# Patient Record
Sex: Male | Born: 1980 | Race: White | Hispanic: No | Marital: Single | Smoking: Current every day smoker
Health system: Southern US, Community
[De-identification: ages and names within clinical notes are randomized; demographics above are authoritative.]

## PROBLEM LIST (undated history)

## (undated) DIAGNOSIS — F319 Bipolar disorder, unspecified: Secondary | ICD-10-CM

---

## 2005-05-08 ENCOUNTER — Emergency Department: Payer: Self-pay | Admitting: Emergency Medicine

## 2009-05-19 ENCOUNTER — Emergency Department: Payer: Self-pay | Admitting: Emergency Medicine

## 2009-10-31 ENCOUNTER — Emergency Department: Payer: Self-pay | Admitting: Internal Medicine

## 2010-03-25 ENCOUNTER — Emergency Department: Payer: Self-pay

## 2014-10-05 ENCOUNTER — Emergency Department: Payer: Self-pay | Admitting: Emergency Medicine

## 2014-10-05 LAB — CBC
HCT: 44 % (ref 40.0–52.0)
HGB: 14.6 g/dL (ref 13.0–18.0)
MCH: 31.6 pg (ref 26.0–34.0)
MCHC: 33.3 g/dL (ref 32.0–36.0)
MCV: 95 fL (ref 80–100)
Platelet: 244 10*3/uL (ref 150–440)
RBC: 4.63 10*6/uL (ref 4.40–5.90)
RDW: 12 % (ref 11.5–14.5)
WBC: 9.4 10*3/uL (ref 3.8–10.6)

## 2014-10-05 LAB — URINALYSIS, COMPLETE
BILIRUBIN, UR: NEGATIVE
Bacteria: NONE SEEN
Blood: NEGATIVE
Glucose,UR: NEGATIVE mg/dL (ref 0–75)
KETONE: NEGATIVE
Nitrite: NEGATIVE
Ph: 6 (ref 4.5–8.0)
Protein: 30
SQUAMOUS EPITHELIAL: NONE SEEN
Specific Gravity: 1.025 (ref 1.003–1.030)

## 2014-10-05 LAB — COMPREHENSIVE METABOLIC PANEL
ALT: 14 U/L
ANION GAP: 5 — AB (ref 7–16)
Albumin: 3.5 g/dL (ref 3.4–5.0)
Alkaline Phosphatase: 107 U/L
BUN: 11 mg/dL (ref 7–18)
Bilirubin,Total: 0.3 mg/dL (ref 0.2–1.0)
CALCIUM: 8.4 mg/dL — AB (ref 8.5–10.1)
CO2: 30 mmol/L (ref 21–32)
CREATININE: 1.21 mg/dL (ref 0.60–1.30)
Chloride: 104 mmol/L (ref 98–107)
EGFR (African American): >60
EGFR (Non-African Amer.): >60
Glucose: 95 mg/dL (ref 65–99)
Osmolality: 277 (ref 275–301)
Potassium: 3.8 mmol/L (ref 3.5–5.1)
SGOT(AST): 21 U/L (ref 15–37)
SODIUM: 139 mmol/L (ref 136–145)
TOTAL PROTEIN: 7.5 g/dL (ref 6.4–8.2)

## 2014-10-06 LAB — CK: CK, TOTAL: 63 U/L (ref 39–308)

## 2015-12-16 ENCOUNTER — Emergency Department (HOSPITAL_COMMUNITY): Payer: Self-pay

## 2015-12-16 ENCOUNTER — Emergency Department (HOSPITAL_COMMUNITY)
Admission: EM | Admit: 2015-12-16 | Discharge: 2015-12-16 | Disposition: A | Discharge status: Home / Self Care | Payer: Self-pay | Attending: Emergency Medicine | Admitting: Emergency Medicine

## 2015-12-16 ENCOUNTER — Encounter (HOSPITAL_COMMUNITY): Payer: Self-pay | Admitting: Emergency Medicine

## 2015-12-16 DIAGNOSIS — Y929 Unspecified place or not applicable: Secondary | ICD-10-CM | POA: Insufficient documentation

## 2015-12-16 DIAGNOSIS — Y939 Activity, unspecified: Secondary | ICD-10-CM | POA: Insufficient documentation

## 2015-12-16 DIAGNOSIS — Y999 Unspecified external cause status: Secondary | ICD-10-CM | POA: Insufficient documentation

## 2015-12-16 DIAGNOSIS — S8001XA Contusion of right knee, initial encounter: Secondary | ICD-10-CM | POA: Insufficient documentation

## 2015-12-16 DIAGNOSIS — F17210 Nicotine dependence, cigarettes, uncomplicated: Secondary | ICD-10-CM | POA: Insufficient documentation

## 2015-12-16 DIAGNOSIS — W2209XA Striking against other stationary object, initial encounter: Secondary | ICD-10-CM | POA: Insufficient documentation

## 2015-12-16 MED ORDER — DICLOFENAC SODIUM 50 MG PO TBEC: 50.0000 mg | DELAYED_RELEASE_TABLET | Freq: Three times a day (TID) | ORAL | Status: DC

## 2015-12-16 MED ORDER — HYDROCODONE-ACETAMINOPHEN 5-325 MG PO TABS: 2.0000 | ORAL_TABLET | ORAL | Status: DC | PRN
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
## 2015-12-16 NOTE — ED Notes (Signed)
Pt reports hitting inside of RT knee on Friday on concrete. States pain has progressively increased. Pt ambulatory.

## 2015-12-16 NOTE — Discharge Instructions (Signed)

## 2016-04-23 ENCOUNTER — Encounter (HOSPITAL_COMMUNITY): Payer: Self-pay | Admitting: Emergency Medicine

## 2016-04-23 ENCOUNTER — Emergency Department (HOSPITAL_COMMUNITY): Payer: No Typology Code available for payment source

## 2016-04-23 ENCOUNTER — Other Ambulatory Visit (HOSPITAL_COMMUNITY): Payer: Self-pay | Admitting: Family Medicine

## 2016-04-23 ENCOUNTER — Emergency Department (HOSPITAL_COMMUNITY)
Admission: EM | Admit: 2016-04-23 | Discharge: 2016-04-23 | Disposition: A | Discharge status: Home / Self Care | Payer: No Typology Code available for payment source | Attending: Emergency Medicine | Admitting: Emergency Medicine

## 2016-04-23 DIAGNOSIS — Y939 Activity, unspecified: Secondary | ICD-10-CM | POA: Insufficient documentation

## 2016-04-23 DIAGNOSIS — Y999 Unspecified external cause status: Secondary | ICD-10-CM | POA: Insufficient documentation

## 2016-04-23 DIAGNOSIS — F17210 Nicotine dependence, cigarettes, uncomplicated: Secondary | ICD-10-CM | POA: Diagnosis not present

## 2016-04-23 DIAGNOSIS — S39012A Strain of muscle, fascia and tendon of lower back, initial encounter: Secondary | ICD-10-CM | POA: Diagnosis not present

## 2016-04-23 DIAGNOSIS — M25521 Pain in right elbow: Secondary | ICD-10-CM | POA: Diagnosis not present

## 2016-04-23 DIAGNOSIS — M79642 Pain in left hand: Secondary | ICD-10-CM | POA: Diagnosis not present

## 2016-04-23 DIAGNOSIS — M7918 Myalgia, other site: Secondary | ICD-10-CM

## 2016-04-23 DIAGNOSIS — M25562 Pain in left knee: Secondary | ICD-10-CM | POA: Insufficient documentation

## 2016-04-23 DIAGNOSIS — M545 Low back pain: Secondary | ICD-10-CM | POA: Diagnosis present

## 2016-04-23 DIAGNOSIS — Y92410 Unspecified street and highway as the place of occurrence of the external cause: Secondary | ICD-10-CM | POA: Diagnosis not present

## 2016-04-23 MED ORDER — METHOCARBAMOL 500 MG PO TABS: 500.0000 mg | ORAL_TABLET | Freq: Three times a day (TID) | ORAL | 0 refills | Status: DC

## 2016-04-23 MED ORDER — DICLOFENAC SODIUM 75 MG PO TBEC: 75.0000 mg | DELAYED_RELEASE_TABLET | Freq: Two times a day (BID) | ORAL | 0 refills | Status: DC
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
## 2016-05-21 ENCOUNTER — Encounter: Payer: Self-pay | Admitting: Emergency Medicine

## 2016-05-21 ENCOUNTER — Emergency Department
Admission: EM | Admit: 2016-05-21 | Discharge: 2016-05-21 | Disposition: A | Discharge status: Home / Self Care | Payer: No Typology Code available for payment source | Attending: Emergency Medicine | Admitting: Emergency Medicine

## 2016-05-21 ENCOUNTER — Emergency Department: Payer: No Typology Code available for payment source

## 2016-05-21 DIAGNOSIS — Y999 Unspecified external cause status: Secondary | ICD-10-CM | POA: Insufficient documentation

## 2016-05-21 DIAGNOSIS — F17210 Nicotine dependence, cigarettes, uncomplicated: Secondary | ICD-10-CM | POA: Diagnosis not present

## 2016-05-21 DIAGNOSIS — Y92410 Unspecified street and highway as the place of occurrence of the external cause: Secondary | ICD-10-CM | POA: Diagnosis not present

## 2016-05-21 DIAGNOSIS — Z791 Long term (current) use of non-steroidal anti-inflammatories (NSAID): Secondary | ICD-10-CM | POA: Diagnosis not present

## 2016-05-21 DIAGNOSIS — Y939 Activity, unspecified: Secondary | ICD-10-CM | POA: Insufficient documentation

## 2016-05-21 DIAGNOSIS — M5416 Radiculopathy, lumbar region: Secondary | ICD-10-CM | POA: Diagnosis not present

## 2016-05-21 DIAGNOSIS — M545 Low back pain: Secondary | ICD-10-CM | POA: Diagnosis present

## 2016-05-21 MED ORDER — TRAMADOL HCL 50 MG PO TABS: 50.0000 mg | ORAL_TABLET | Freq: Four times a day (QID) | ORAL | 0 refills | Status: DC | PRN

## 2016-05-21 MED ORDER — BACLOFEN 10 MG PO TABS: 10.0000 mg | ORAL_TABLET | Freq: Three times a day (TID) | ORAL | 0 refills | Status: DC
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
## 2017-02-13 ENCOUNTER — Emergency Department
Admission: EM | Admit: 2017-02-13 | Discharge: 2017-02-13 | Disposition: A | Discharge status: Home / Self Care | Payer: Self-pay | Attending: Emergency Medicine | Admitting: Emergency Medicine

## 2017-02-13 DIAGNOSIS — Z5321 Procedure and treatment not carried out due to patient leaving prior to being seen by health care provider: Secondary | ICD-10-CM | POA: Insufficient documentation

## 2017-02-13 DIAGNOSIS — L02415 Cutaneous abscess of right lower limb: Secondary | ICD-10-CM | POA: Insufficient documentation

## 2017-02-13 DIAGNOSIS — F17210 Nicotine dependence, cigarettes, uncomplicated: Secondary | ICD-10-CM | POA: Insufficient documentation

## 2017-02-13 LAB — COMPREHENSIVE METABOLIC PANEL
ALT: 11 U/L — AB (ref 17–63)
AST: 22 U/L (ref 15–41)
Albumin: 4.1 g/dL (ref 3.5–5.0)
Alkaline Phosphatase: 95 U/L (ref 38–126)
Anion gap: 8 (ref 5–15)
BUN: 14 mg/dL (ref 6–20)
CHLORIDE: 103 mmol/L (ref 101–111)
CO2: 28 mmol/L (ref 22–32)
CREATININE: 0.89 mg/dL (ref 0.61–1.24)
Calcium: 8.9 mg/dL (ref 8.9–10.3)
GFR calc non Af Amer: >60 mL/min (ref >60)
Glucose, Bld: 72 mg/dL (ref 65–99)
Potassium: 3.9 mmol/L (ref 3.5–5.1)
SODIUM: 139 mmol/L (ref 135–145)
Total Bilirubin: 0.3 mg/dL (ref 0.3–1.2)
Total Protein: 7.4 g/dL (ref 6.5–8.1)

## 2017-02-13 LAB — CBC WITH DIFFERENTIAL/PLATELET
Basophils Absolute: 0.1 10*3/uL (ref 0–0.1)
Basophils Relative: 1 %
Eosinophils Absolute: 0.3 10*3/uL (ref 0–0.7)
Eosinophils Relative: 2 %
HEMATOCRIT: 42.2 % (ref 40.0–52.0)
HEMOGLOBIN: 14.6 g/dL (ref 13.0–18.0)
LYMPHS ABS: 2.6 10*3/uL (ref 1.0–3.6)
LYMPHS PCT: 17 %
MCH: 32.1 pg (ref 26.0–34.0)
MCHC: 34.6 g/dL (ref 32.0–36.0)
MCV: 92.6 fL (ref 80.0–100.0)
MONOS PCT: 8 %
Monocytes Absolute: 1.2 10*3/uL — ABNORMAL HIGH (ref 0.2–1.0)
NEUTROS PCT: 74 %
Neutro Abs: 11.2 10*3/uL — ABNORMAL HIGH (ref 1.4–6.5)
Platelets: 255 10*3/uL (ref 150–440)
RBC: 4.56 MIL/uL (ref 4.40–5.90)
RDW: 13.2 % (ref 11.5–14.5)
WBC: 15.3 10*3/uL — ABNORMAL HIGH (ref 3.8–10.6)

## 2017-02-13 NOTE — ED Triage Notes (Signed)
Patient c/o abscess to back of leg beginning yesterday. Patient has redness/warmth around abscess beginning today, extending approx 8-10" vertically and 4-6" horizontally.

## 2017-02-14 ENCOUNTER — Emergency Department
Admission: EM | Admit: 2017-02-14 | Discharge: 2017-02-14 | Disposition: A | Discharge status: Home / Self Care | Payer: Self-pay | Attending: Emergency Medicine | Admitting: Emergency Medicine

## 2017-02-14 ENCOUNTER — Encounter: Payer: Self-pay | Admitting: Emergency Medicine

## 2017-02-14 DIAGNOSIS — L02415 Cutaneous abscess of right lower limb: Secondary | ICD-10-CM | POA: Insufficient documentation

## 2017-02-14 DIAGNOSIS — F17210 Nicotine dependence, cigarettes, uncomplicated: Secondary | ICD-10-CM | POA: Insufficient documentation

## 2017-02-14 MED ORDER — OXYCODONE-ACETAMINOPHEN 7.5-325 MG PO TABS: 1.0000 | ORAL_TABLET | Freq: Four times a day (QID) | ORAL | 0 refills | Status: AC | PRN

## 2017-02-14 MED ORDER — SULFAMETHOXAZOLE-TRIMETHOPRIM 800-160 MG PO TABS: 1.0000 | ORAL_TABLET | Freq: Two times a day (BID) | ORAL | 0 refills | Status: AC

## 2017-02-14 MED ORDER — LIDOCAINE HCL (PF) 1 % IJ SOLN
INTRAMUSCULAR | Status: AC
  Filled 2017-02-14: qty 5

## 2017-02-14 MED ORDER — SULFAMETHOXAZOLE-TRIMETHOPRIM 800-160 MG PO TABS
1.0000 | ORAL_TABLET | Freq: Once | ORAL | Status: AC
  Administered 2017-02-14: 1 via ORAL
  Filled 2017-02-14: qty 1

## 2017-02-14 MED ORDER — OXYCODONE-ACETAMINOPHEN 5-325 MG PO TABS
1.0000 | ORAL_TABLET | Freq: Once | ORAL | Status: AC
  Administered 2017-02-14: 1 via ORAL
  Filled 2017-02-14: qty 1

## 2017-02-14 MED ORDER — IBUPROFEN 600 MG PO TABS: 600.0000 mg | ORAL_TABLET | Freq: Three times a day (TID) | ORAL | 0 refills | Status: AC | PRN

## 2017-02-14 MED ORDER — IBUPROFEN 600 MG PO TABS
600.0000 mg | ORAL_TABLET | Freq: Once | ORAL | Status: AC
Start: 2017-02-14 — End: 2017-02-14
  Administered 2017-02-14: 600 mg via ORAL
  Filled 2017-02-14: qty 1

## 2017-02-14 NOTE — ED Notes (Signed)
Patient provided information for Medication Management to help him get back on his medications

## 2017-02-14 NOTE — ED Notes (Signed)
Pt with large abscess to his right calf; has tried to open area to drain with little amount of drainage; pt encouraged not to open areas at home; redness and warmth to entire calf with swelling also present; PA Smith at bedside

## 2017-02-14 NOTE — ED Provider Notes (Signed)
[facility] Emergency Department Provider Note   ____________________________________________   First MD Initiated Contact with Patient 02/14/17 2255     (approximate)  I have reviewed the triage vital signs and the nursing notes.   HISTORY  Chief Complaint Abscess    HPI William Mccarthy is a 36 y.o. male patient presents today with pain, edema, and drainage right calf. Patient was here yesterday but left without being seen due to weight time. Patient say went home and "pop" the lesion and pain increased.patient rates the pain as a 9/10. Patient described a pain as "throbbing". Except for dressing no other palliative measures prior to arrival.   History reviewed. No pertinent past medical history.  There are no active problems to display for this patient.   History reviewed. No pertinent surgical history.  Prior to Admission medications   Medication Sig Start Date End Date Taking? Authorizing Provider  ibuprofen (ADVIL,MOTRIN) 600 MG tablet Take 1 tablet (600 mg total) by mouth every 8 (eight) hours as needed. 02/14/17   Joni ReiningSmith, Ronald K, PA-C  oxyCODONE-acetaminophen (PERCOCET) 7.5-325 MG tablet Take 1 tablet by mouth every 6 (six) hours as needed for severe pain. 02/14/17   Joni ReiningSmith, Ronald K, PA-C  sulfamethoxazole-trimethoprim (BACTRIM DS,SEPTRA DS) 800-160 MG tablet Take 1 tablet by mouth 2 (two) times daily. 02/14/17   Joni ReiningSmith, Ronald K, PA-C    Allergies Bee venom  Family History  Problem Relation Age of Onset  . Diabetes Mother     Social History Social History  Substance Use Topics  . Smoking status: Current Every Day Smoker    Packs/day: 0.50    Types: Cigarettes  . Smokeless tobacco: Never Used  . Alcohol use No    Review of Systems  Constitutional: No fever/chills Eyes: No visual changes. ENT: No sore throat. Cardiovascular: Denies chest pain. Respiratory: Denies shortness of breath. Gastrointestinal: No abdominal pain.  No  nausea, no vomiting.  No diarrhea.  No constipation. Genitourinary: Negative for dysuria. Musculoskeletal: Negative for back pain. Skin: Negative for rash. Neurological: Negative for headaches, focal weakness or numbness.   ____________________________________________   PHYSICAL EXAM:  VITAL SIGNS: ED Triage Vitals  Enc Vitals Group     BP 02/14/17 2239 139/86     Pulse Rate 02/14/17 2239 99     Resp 02/14/17 2239 18     Temp 02/14/17 2239 99.1 F (37.3 C)     Temp Source 02/14/17 2239 Oral     SpO2 02/14/17 2239 98 %     Weight 02/14/17 2240 183 lb (83 kg)     Height 02/14/17 2240 5\' 5"  (1.651 m)     Head Circumference --      Peak Flow --      Pain Score 02/14/17 2241 9     Pain Loc --      Pain Edu? --      Excl. in GC? --     Constitutional: Alert and oriented. Well appearing and in no acute distress. Cardiovascular: Normal rate, regular rhythm. Grossly normal heart sounds.  Good peripheral circulation. Respiratory: Normal respiratory effort.  No retractions. Lungs CTAB. Neurologic:  Normal speech and language. No gross focal neurologic deficits are appreciated. No gait instability. Skin:  Skin is warm, dry and intact. No rash noted.nodule lesion on erythematous base with moderate amount of drainage. Psychiatric: Mood and affect are normal. Speech and behavior are normal.  ____________________________________________   LABS (all labs ordered are listed, but only abnormal results  are displayed)  Labs Reviewed - No data to display ____________________________________________  EKG   ____________________________________________  RADIOLOGY   ____________________________________________   PROCEDURES  Procedure(s) performed: INCISION AND DRAINAGE Performed by: Joni Reining Consent: Verbal consent obtained. Risks and benefits: risks, benefits and alternatives were discussed Type: abscess  Body area: right calf Anesthesia: local infiltration  Incision  was made with a scalpel.  Local anesthetic: lidocaine 1% without epinephrine  Anesthetic total: 5ml  Complexity: complex Blunt dissection to break up loculations  Drainage: purulent  Drainage amount:small Packing material: 1/4 in iodoform gauze  Patient tolerance: Patient tolerated the procedure well with no immediate complications.     Procedures  Critical Care performed: No  ____________________________________________   INITIAL IMPRESSION / ASSESSMENT AND PLAN / ED COURSE  Pertinent labs & imaging results that were available during my care of the patient were reviewed by me and considered in my medical decision making (see chart for details).  Abscess right calf. Patient given discharge care instructions. Patient advised follow-up in 2 days for wound check. Patient given a work note.      ____________________________________________   FINAL CLINICAL IMPRESSION(S) / ED DIAGNOSES  Final diagnoses:  Abscess of right leg excluding foot      NEW MEDICATIONS STARTED DURING THIS VISIT:  New Prescriptions   IBUPROFEN (ADVIL,MOTRIN) 600 MG TABLET    Take 1 tablet (600 mg total) by mouth every 8 (eight) hours as needed.   OXYCODONE-ACETAMINOPHEN (PERCOCET) 7.5-325 MG TABLET    Take 1 tablet by mouth every 6 (six) hours as needed for severe pain.   SULFAMETHOXAZOLE-TRIMETHOPRIM (BACTRIM DS,SEPTRA DS) 800-160 MG TABLET    Take 1 tablet by mouth 2 (two) times daily.     Note:  This document was prepared using Dragon voice recognition software and may include unintentional dictation errors.    Joni Reining, PA-C 02/14/17 2324    Pershing Proud Myra Rude, MD 02/14/17 [phone]

## 2017-02-14 NOTE — ED Triage Notes (Signed)
Pt was seen here yesterday but LWABS due to wait time. Pt has red open area to the back to right leg. Pt report area has a throbbing pain when siting still.Pt reports area has been covered since pt "popped it."

## 2017-02-16 ENCOUNTER — Encounter: Payer: Self-pay | Admitting: Emergency Medicine

## 2017-02-16 ENCOUNTER — Encounter: Payer: Self-pay | Admitting: Intensive Care

## 2017-02-16 ENCOUNTER — Emergency Department
Admission: EM | Admit: 2017-02-16 | Discharge: 2017-02-16 | Disposition: A | Discharge status: Home / Self Care | Payer: Self-pay | Attending: Student in an Organized Health Care Education/Training Program | Admitting: Student in an Organized Health Care Education/Training Program

## 2017-02-16 ENCOUNTER — Emergency Department
Admission: EM | Admit: 2017-02-16 | Discharge: 2017-02-16 | Disposition: A | Discharge status: Home / Self Care | Payer: Self-pay | Attending: Emergency Medicine | Admitting: Emergency Medicine

## 2017-02-16 DIAGNOSIS — Z4800 Encounter for change or removal of nonsurgical wound dressing: Secondary | ICD-10-CM | POA: Insufficient documentation

## 2017-02-16 DIAGNOSIS — F17210 Nicotine dependence, cigarettes, uncomplicated: Secondary | ICD-10-CM | POA: Insufficient documentation

## 2017-02-16 DIAGNOSIS — Z5189 Encounter for other specified aftercare: Secondary | ICD-10-CM

## 2017-02-16 DIAGNOSIS — Z5321 Procedure and treatment not carried out due to patient leaving prior to being seen by health care provider: Secondary | ICD-10-CM | POA: Insufficient documentation

## 2017-02-16 NOTE — ED Provider Notes (Signed)
[facility] Emergency Department Provider Note   ____________________________________________   None    (approximate)  I have reviewed the triage vital signs and the nursing notes.   HISTORY  Chief Complaint Recurrent Skin Infections    HPI William Mccarthy is a 36 y.o. male patient presents for wound check secondary to incision and drainages of an abscess of the right calf. Procedures done 2 days ago.patient rates pain as a 9/10. Patient describes pain as "achy". Patient denies any fevers status post procedure. Patient said drainage has been mild.   History reviewed. No pertinent past medical history.  There are no active problems to display for this patient.   History reviewed. No pertinent surgical history.  Prior to Admission medications   Medication Sig Start Date End Date Taking? Authorizing Provider  ibuprofen (ADVIL,MOTRIN) 600 MG tablet Take 1 tablet (600 mg total) by mouth every 8 (eight) hours as needed. 02/14/17   Joni ReiningSmith, Ronald K, PA-C  oxyCODONE-acetaminophen (PERCOCET) 7.5-325 MG tablet Take 1 tablet by mouth every 6 (six) hours as needed for severe pain. 02/14/17   Joni ReiningSmith, Ronald K, PA-C  sulfamethoxazole-trimethoprim (BACTRIM DS,SEPTRA DS) 800-160 MG tablet Take 1 tablet by mouth 2 (two) times daily. 02/14/17   Joni ReiningSmith, Ronald K, PA-C    Allergies Bee venom  Family History  Problem Relation Age of Onset  . Diabetes Mother     Social History Social History  Substance Use Topics  . Smoking status: Current Every Day Smoker    Packs/day: 0.50    Types: Cigarettes  . Smokeless tobacco: Never Used  . Alcohol use No    Review of Systems  Constitutional: No fever/chills Cardiovascular: Denies chest pain. Respiratory: Denies shortness of breath. Skin: Negative for rash. Abscess right leg Neurological: Negative for headaches, focal weakness or numbness. Allergic/Immunilogical:bee  sting  ____________________________________________   PHYSICAL EXAM:  VITAL SIGNS: ED Triage Vitals  Enc Vitals Group     BP 02/16/17 1600 136/81     Pulse Rate 02/16/17 1600 69     Resp 02/16/17 1600 18     Temp 02/16/17 1600 98 F (36.7 C)     Temp Source 02/16/17 1600 Oral     SpO2 02/16/17 1600 99 %     Weight 02/16/17 1603 183 lb (83 kg)     Height 02/16/17 1603 5\' 5"  (1.651 m)     Head Circumference --      Peak Flow --      Pain Score 02/16/17 1603 9     Pain Loc --      Pain Edu? --      Excl. in GC? --     Constitutional: Alert and oriented. Well appearing and in no acute distress. Cardiovascular: Normal rate, regular rhythm. Grossly normal heart sounds.  Good peripheral circulation. Respiratory: Normal respiratory effort.  No retractions. Lungs CTAB. Neurologic:  Normal speech and language. No gross focal neurologic deficits are appreciated. No gait instability. Skin:  Skin is warm, dry and intact. No rash noted. Mild drainage status post Idofoam removal. Clear return status post normal saline irrigation. :Psychiatric: Mood and affect are normal. Speech and behavior are normal.  ____________________________________________   LABS (all labs ordered are listed, but only abnormal results are displayed)  Labs Reviewed - No data to display ____________________________________________  EKG   ____________________________________________  RADIOLOGY   ____________________________________________   PROCEDURES  Procedure(s) performed: None  Procedures  Critical Care performed: No  ____________________________________________   INITIAL IMPRESSION /  ASSESSMENT AND PLAN / ED COURSE  Pertinent labs & imaging results that were available during my care of the patient were reviewed by me and considered in my medical decision making (see chart for details).  Wound check reveals healing abscess right lower leg. Patient advised continue antibiotics as  directed. Patient given instructions upon discharge. Patient given a work note. Patient advised to follow-up condition worsens.      ____________________________________________   FINAL CLINICAL IMPRESSION(S) / ED DIAGNOSES  Final diagnoses:  Wound check, abscess      NEW MEDICATIONS STARTED DURING THIS VISIT:  New Prescriptions   No medications on file     Note:  This document was prepared using Dragon voice recognition software and may include unintentional dictation errors.    Joni Reining, PA-C 02/16/17 1629    Willy Eddy, MD 02/16/17 [phone]

## 2017-02-16 NOTE — ED Triage Notes (Signed)
Patient reports he is here today for follow up on abscess on his R calf. Was seen here Sunday and states they told him to come back today to have it checked out. Ambulated back to room with no problems. C/o 9 out of 10 pain in R calf

## 2017-02-16 NOTE — ED Triage Notes (Signed)
Pt here for wound recheck to right leg

## 2017-02-16 NOTE — ED Notes (Signed)
NAD noted at time of D/C. Pt denies questions or concerns. Pt ambulatory to the lobby at this time.  

## 2017-02-16 NOTE — Discharge Instructions (Signed)
Continue previous medication and daily wound care as directed.

## 2017-05-30 ENCOUNTER — Emergency Department
Admission: EM | Admit: 2017-05-30 | Discharge: 2017-05-30 | Disposition: A | Discharge status: Home / Self Care | Payer: Self-pay | Attending: Emergency Medicine | Admitting: Emergency Medicine

## 2017-05-30 DIAGNOSIS — F319 Bipolar disorder, unspecified: Secondary | ICD-10-CM | POA: Insufficient documentation

## 2017-05-30 DIAGNOSIS — F3161 Bipolar disorder, current episode mixed, mild: Secondary | ICD-10-CM

## 2017-05-30 DIAGNOSIS — F17210 Nicotine dependence, cigarettes, uncomplicated: Secondary | ICD-10-CM | POA: Insufficient documentation

## 2017-05-30 DIAGNOSIS — F1290 Cannabis use, unspecified, uncomplicated: Secondary | ICD-10-CM | POA: Insufficient documentation

## 2017-05-30 DIAGNOSIS — Z9119 Patient's noncompliance with other medical treatment and regimen: Secondary | ICD-10-CM | POA: Insufficient documentation

## 2017-05-30 DIAGNOSIS — F1520 Other stimulant dependence, uncomplicated: Secondary | ICD-10-CM | POA: Insufficient documentation

## 2017-05-30 HISTORY — DX: Bipolar disorder, unspecified: F31.9

## 2017-05-30 LAB — URINE DRUG SCREEN, QUALITATIVE (ARMC ONLY)
Amphetamines, Ur Screen: POSITIVE — AB
Barbiturates, Ur Screen: NOT DETECTED
Benzodiazepine, Ur Scrn: NOT DETECTED
CANNABINOID 50 NG, UR ~~LOC~~: POSITIVE — AB
COCAINE METABOLITE, UR ~~LOC~~: NOT DETECTED
MDMA (ECSTASY) UR SCREEN: NOT DETECTED
Methadone Scn, Ur: NOT DETECTED
OPIATE, UR SCREEN: NOT DETECTED
PHENCYCLIDINE (PCP) UR S: NOT DETECTED
TRICYCLIC, UR SCREEN: NOT DETECTED

## 2017-05-30 LAB — CBC
HCT: 43.6 % (ref 40.0–52.0)
HEMOGLOBIN: 15.6 g/dL (ref 13.0–18.0)
MCH: 33 pg (ref 26.0–34.0)
MCHC: 35.7 g/dL (ref 32.0–36.0)
MCV: 92.6 fL (ref 80.0–100.0)
Platelets: 240 10*3/uL (ref 150–440)
RBC: 4.71 MIL/uL (ref 4.40–5.90)
RDW: 12.9 % (ref 11.5–14.5)
WBC: 10.3 10*3/uL (ref 3.8–10.6)

## 2017-05-30 LAB — SALICYLATE LEVEL: Salicylate Lvl: <7 mg/dL (ref 2.8–30.0)

## 2017-05-30 LAB — COMPREHENSIVE METABOLIC PANEL
ALT: 10 U/L — ABNORMAL LOW (ref 17–63)
ANION GAP: 7 (ref 5–15)
AST: 19 U/L (ref 15–41)
Albumin: 4.1 g/dL (ref 3.5–5.0)
Alkaline Phosphatase: 69 U/L (ref 38–126)
BUN: 8 mg/dL (ref 6–20)
CHLORIDE: 102 mmol/L (ref 101–111)
CO2: 28 mmol/L (ref 22–32)
CREATININE: 1.07 mg/dL (ref 0.61–1.24)
Calcium: 9.1 mg/dL (ref 8.9–10.3)
GLUCOSE: 116 mg/dL — AB (ref 65–99)
Potassium: 3.5 mmol/L (ref 3.5–5.1)
SODIUM: 137 mmol/L (ref 135–145)
TOTAL PROTEIN: 7.1 g/dL (ref 6.5–8.1)
Total Bilirubin: 0.9 mg/dL (ref 0.3–1.2)

## 2017-05-30 LAB — ETHANOL: Alcohol, Ethyl (B): <5 mg/dL (ref <5)

## 2017-05-30 LAB — ACETAMINOPHEN LEVEL: Acetaminophen (Tylenol), Serum: <10 ug/mL — ABNORMAL LOW (ref 10–30)

## 2017-05-30 NOTE — ED Notes (Signed)
BEHAVIORAL HEALTH ROUNDING  Patient sleeping: No.  Patient alert and oriented: yes  Behavior appropriate: Yes. ; If no, describe:  Nutrition and fluids offered: Yes  Toileting and hygiene offered: Yes  Sitter present: not applicable, Q 15 min safety rounds and observation.  Law enforcement present: Yes ODS  

## 2017-05-30 NOTE — ED Notes (Signed)
Pt Belonging Bag: Crystal LawnsBelt, Shorts, Briefs, NordstromFlip flops, CrenshawShirt, box of Cigarettes, Wallet no cash inside, cell phone. Pt bag passed off to Ryland GroupQuad RN.

## 2017-05-30 NOTE — ED Provider Notes (Signed)
[facility] Emergency Department Provider Note   ____________________________________________   First MD Initiated Contact with Patient 05/30/17 0151     (approximate)  I have reviewed the triage vital signs and the nursing notes.   HISTORY  Chief Complaint Medical Clearance    HPI William Mccarthy is a 36 y.o. male who presents to the ED escorted by police with a chief complaint of detox and needs bipolar medication. Patient states he has not been on his bipolar medications for several months due to cost. Recently released from jail and went straight back to using meth. Request detox from meth and is interested in getting back on bipolar medications. Denies active SI/HI/AH/VH. Voices no medical complaints.   Past Medical History:  Diagnosis Date  . Bipolar 1 disorder (HCC)     There are no active problems to display for this patient.   History reviewed. No pertinent surgical history.  Prior to Admission medications   Medication Sig Start Date End Date Taking? Authorizing Provider  ibuprofen (ADVIL,MOTRIN) 600 MG tablet Take 1 tablet (600 mg total) by mouth every 8 (eight) hours as needed. 02/14/17   Joni ReiningSmith, Ronald K, PA-C  oxyCODONE-acetaminophen (PERCOCET) 7.5-325 MG tablet Take 1 tablet by mouth every 6 (six) hours as needed for severe pain. 02/14/17   Joni ReiningSmith, Ronald K, PA-C  sulfamethoxazole-trimethoprim (BACTRIM DS,SEPTRA DS) 800-160 MG tablet Take 1 tablet by mouth 2 (two) times daily. 02/14/17   Joni ReiningSmith, Ronald K, PA-C    Allergies Bee venom  Family History  Problem Relation Age of Onset  . Diabetes Mother     Social History Social History  Substance Use Topics  . Smoking status: Current Every Day Smoker    Packs/day: 0.50    Types: Cigarettes  . Smokeless tobacco: Never Used  . Alcohol use No    Review of Systems  Constitutional: No fever/chills. Eyes: No visual changes. ENT: No sore throat. Cardiovascular: Denies chest  pain. Respiratory: Denies shortness of breath. Gastrointestinal: No abdominal pain.  No nausea, no vomiting.  No diarrhea.  No constipation. Genitourinary: Negative for dysuria. Musculoskeletal: Negative for back pain. Skin: Negative for rash. Neurological: Negative for headaches, focal weakness or numbness. Psychiatric:positive for substance use.  ____________________________________________   PHYSICAL EXAM:  VITAL SIGNS: ED Triage Vitals [05/30/17 0118]  Enc Vitals Group     BP      Pulse      Resp      Temp      Temp src      SpO2      Weight 165 lb (74.8 kg)     Height 5\' 5"  (1.651 m)     Head Circumference      Peak Flow      Pain Score 0     Pain Loc      Pain Edu?      Excl. in GC?     Constitutional: Alert and oriented. Well appearing and in no acute distress. Eyes: Conjunctivae are normal. PERRL. EOMI. Head: Atraumatic. Nose: No congestion/rhinnorhea. Mouth/Throat: Mucous membranes are moist.  Oropharynx non-erythematous. Neck: No stridor.   Cardiovascular: Normal rate, regular rhythm. Grossly normal heart sounds.  Good peripheral circulation. Respiratory: Normal respiratory effort.  No retractions. Lungs CTAB. Gastrointestinal: Soft and nontender. No distention. No abdominal bruits. No CVA tenderness. Musculoskeletal: No lower extremity tenderness nor edema.  No joint effusions. Neurologic:  Normal speech and language. No gross focal neurologic deficits are appreciated. No gait instability. Skin:  Skin  is warm, dry and intact. No rash noted. Multiple meth sores which are not infected. Psychiatric: Mood and affect are normal. Speech and behavior are normal.  ____________________________________________   LABS (all labs ordered are listed, but only abnormal results are displayed)  Labs Reviewed  COMPREHENSIVE METABOLIC PANEL - Abnormal; Notable for the following:       Result Value   Glucose, Bld 116 (*)    ALT 10 (*)    All other components within  normal limits  URINE DRUG SCREEN, QUALITATIVE ([facility] ONLY) - Abnormal; Notable for the following:    Amphetamines, Ur Screen POSITIVE (*)    Cannabinoid 50 Ng, Ur [place] POSITIVE (*)    All other components within normal limits  ACETAMINOPHEN LEVEL - Abnormal; Notable for the following:    Acetaminophen (Tylenol), Serum <10 (*)    All other components within normal limits  ETHANOL  CBC  SALICYLATE LEVEL   ____________________________________________  EKG  none ____________________________________________  RADIOLOGY  No results found.  ____________________________________________   PROCEDURES  Procedure(s) performed: None  Procedures  Critical Care performed: No  ____________________________________________   INITIAL IMPRESSION / ASSESSMENT AND PLAN / ED COURSE  Pertinent labs & imaging results that were available during my care of the patient were reviewed by me and considered in my medical decision making (see chart for details).  36 year old male with a history of bipolar disorder, currently using methamphetamines who presents for detox and psychiatric medications. Not suicidal or homicidal. Will ask TTS to provide outpatient resources.  Clinical Course as of May 30 430  Wynelle Link May 30, 2017  1610 Patient was evaluated by TTS who provided him with outpatient resources. He is instructed to call [facility] on Tuesday for an appointment to start psychiatric medication for bipolar disorder which he can afford. Strict return precautions given. Patient verbalizes understanding and agrees with plan of care.  [JS]    Clinical Course User Index [JS] Irean Hong, MD     ____________________________________________   FINAL CLINICAL IMPRESSION(S) / ED DIAGNOSES  Final diagnoses:  Bipolar disorder, current episode mixed, mild (HCC)  Methamphetamine use disorder, moderate, dependence (HCC)  Marijuana use      NEW MEDICATIONS STARTED DURING THIS VISIT:  New Prescriptions    No medications on file     Note:  This document was prepared using Dragon voice recognition software and may include unintentional dictation errors.    Irean Hong, MD 05/30/17 [phone]

## 2017-05-30 NOTE — BH Assessment (Signed)
The patient reported he came in to get back on his medication for bipolar disorder.  He is unsure what he was taking, but remembers that it helped him.  The patient reported he has been self medicating with crystal meth daily, so he can feel better.  He reports he does not want to go to a detox facility and can detox at home.  He denies ever going to to a detox facility or being at a mental health hospital.  He denies SI, HI, and psychosis.  The patient was given resources for outpatient SA services.

## 2017-05-30 NOTE — Discharge Instructions (Signed)
Please call the number provided to make an appointment to follow-up to start medicines for your bipolar disorder. Return to the ER for worsening symptoms, feelings of hurting yourself or others, or other concerns.

## 2017-05-30 NOTE — ED Notes (Signed)
Pt reports he wants to get back on his bipolar medications but is unable to tell this RN what meds he has taken in the past. Pt also reorts he has been using crystal meth and would like some information of places to go for help with that. Pt denies SI/HI, AH/VH at this time.

## 2017-05-30 NOTE — ED Triage Notes (Signed)
Patient here to get detox from meth although "i don't need it". Also needs to get back on his medicine for bipolar. Has been locked up for 36 days and was released on the 21st of last month. States he went right back to using the meth. He wants to get off of it.

## 2017-08-31 ENCOUNTER — Encounter: Payer: Self-pay | Admitting: Emergency Medicine

## 2017-08-31 ENCOUNTER — Emergency Department: Payer: Self-pay

## 2017-08-31 ENCOUNTER — Emergency Department
Admission: EM | Admit: 2017-08-31 | Discharge: 2017-08-31 | Disposition: A | Discharge status: Home / Self Care | Payer: Self-pay | Attending: Emergency Medicine | Admitting: Emergency Medicine

## 2017-08-31 ENCOUNTER — Other Ambulatory Visit: Payer: Self-pay

## 2017-08-31 DIAGNOSIS — R310 Gross hematuria: Secondary | ICD-10-CM

## 2017-08-31 DIAGNOSIS — N3091 Cystitis, unspecified with hematuria: Secondary | ICD-10-CM | POA: Insufficient documentation

## 2017-08-31 DIAGNOSIS — F17210 Nicotine dependence, cigarettes, uncomplicated: Secondary | ICD-10-CM | POA: Insufficient documentation

## 2017-08-31 DIAGNOSIS — Z79899 Other long term (current) drug therapy: Secondary | ICD-10-CM | POA: Insufficient documentation

## 2017-08-31 DIAGNOSIS — N3090 Cystitis, unspecified without hematuria: Secondary | ICD-10-CM

## 2017-08-31 LAB — URINALYSIS, COMPLETE (UACMP) WITH MICROSCOPIC
Bacteria, UA: NONE SEEN
SPECIFIC GRAVITY, URINE: 1.027 (ref 1.005–1.030)

## 2017-08-31 LAB — BASIC METABOLIC PANEL
ANION GAP: 8 (ref 5–15)
BUN: 12 mg/dL (ref 6–20)
CHLORIDE: 100 mmol/L — AB (ref 101–111)
CO2: 27 mmol/L (ref 22–32)
Calcium: 9.2 mg/dL (ref 8.9–10.3)
Creatinine, Ser: 0.91 mg/dL (ref 0.61–1.24)
GFR calc Af Amer: >60 mL/min (ref >60)
GFR calc non Af Amer: >60 mL/min (ref >60)
GLUCOSE: 100 mg/dL — AB (ref 65–99)
POTASSIUM: 4.1 mmol/L (ref 3.5–5.1)
Sodium: 135 mmol/L (ref 135–145)

## 2017-08-31 LAB — CBC WITH DIFFERENTIAL/PLATELET
BASOS ABS: 0 10*3/uL (ref 0–0.1)
Basophils Relative: 0 %
EOS PCT: 1 %
Eosinophils Absolute: 0.1 10*3/uL (ref 0–0.7)
HEMATOCRIT: 43.4 % (ref 40.0–52.0)
HEMOGLOBIN: 15.1 g/dL (ref 13.0–18.0)
LYMPHS ABS: 2 10*3/uL (ref 1.0–3.6)
LYMPHS PCT: 15 %
MCH: 32.8 pg (ref 26.0–34.0)
MCHC: 34.9 g/dL (ref 32.0–36.0)
MCV: 94.1 fL (ref 80.0–100.0)
Monocytes Absolute: 1.2 10*3/uL — ABNORMAL HIGH (ref 0.2–1.0)
Monocytes Relative: 9 %
NEUTROS ABS: 10.1 10*3/uL — AB (ref 1.4–6.5)
Neutrophils Relative %: 75 %
PLATELETS: 188 10*3/uL (ref 150–440)
RBC: 4.61 MIL/uL (ref 4.40–5.90)
RDW: 12.3 % (ref 11.5–14.5)
WBC: 13.5 10*3/uL — AB (ref 3.8–10.6)

## 2017-08-31 LAB — CHLAMYDIA/NGC RT PCR (ARMC ONLY)
Chlamydia Tr: NOT DETECTED
N GONORRHOEAE: NOT DETECTED

## 2017-08-31 MED ORDER — KETOROLAC TROMETHAMINE 10 MG PO TABS: 10.0000 mg | ORAL_TABLET | Freq: Three times a day (TID) | ORAL | 0 refills | Status: AC

## 2017-08-31 MED ORDER — KETOROLAC TROMETHAMINE 30 MG/ML IJ SOLN
30.0000 mg | Freq: Once | INTRAMUSCULAR | Status: AC
  Administered 2017-08-31: 30 mg via INTRAVENOUS
  Filled 2017-08-31: qty 1

## 2017-08-31 MED ORDER — IOPAMIDOL (ISOVUE-300) INJECTION 61%
100.0000 mL | Freq: Once | INTRAVENOUS | Status: AC | PRN
  Administered 2017-08-31: 100 mL via INTRAVENOUS
  Filled 2017-08-31: qty 100

## 2017-08-31 MED ORDER — CIPROFLOXACIN HCL 500 MG PO TABS: 500.0000 mg | ORAL_TABLET | Freq: Two times a day (BID) | ORAL | 0 refills | Status: AC

## 2017-08-31 MED ORDER — CIPROFLOXACIN HCL 500 MG PO TABS
500.0000 mg | ORAL_TABLET | Freq: Once | ORAL | Status: AC
  Administered 2017-08-31: 500 mg via ORAL
  Filled 2017-08-31: qty 1

## 2017-08-31 NOTE — ED Provider Notes (Signed)
[facility] Emergency Department Provider Note ____________________________________________  Time seen: 1837  I have reviewed the triage vital signs and the nursing notes.  HISTORY  Chief Complaint  Hematuria  HPI William Mccarthy is a 36 y.o. male resents himself to the ED for evaluation of gross hematuria today.  Patient describes that he awoke today with a normal initial morning void.  He describes by mid morning, with a second voiding, he had passed large blood clots and a large amount of gross blood.  He describes he had pressure and difficulty initiating his urinary flow.  He also describes some discomfort to the left lower abdominal region.  He also describes some bilateral testicular pain for 1 day.  He denies any swelling, bruising, or edema noted to the testicles.  He denies any penile discharge, trauma, fevers, chills, or sweats.  Also denies any history of kidney stones, prostatitis, or hematuria.  He does also admit to feeling unwell over the last 24 hours.  He has been without any nausea, vomiting, flank pain, or low back pain.  He does give a remote history of STI infection, but denies that his current symptoms are similar.  He admits also that his urinary sample provided since reported to the ED is still dark in color, but not as dark as his initial episode of hematuria.  Past Medical History:  Diagnosis Date  . Bipolar 1 disorder (HCC)     There are no active problems to display for this patient.   History reviewed. No pertinent surgical history.  Prior to Admission medications   Medication Sig Start Date End Date Taking? Authorizing Provider  ciprofloxacin (CIPRO) 500 MG tablet Take 1 tablet (500 mg total) by mouth 2 (two) times daily for 5 days. 08/31/17 09/05/17  Teyton Pattillo, Charlesetta IvoryJenise V Bacon, PA-C  ibuprofen (ADVIL,MOTRIN) 600 MG tablet Take 1 tablet (600 mg total) by mouth every 8 (eight) hours as needed. 02/14/17   Joni ReiningSmith, Ronald K, PA-C  ketorolac  (TORADOL) 10 MG tablet Take 1 tablet (10 mg total) by mouth every 8 (eight) hours. 08/31/17   Yerania Chamorro, Charlesetta IvoryJenise V Bacon, PA-C  oxyCODONE-acetaminophen (PERCOCET) 7.5-325 MG tablet Take 1 tablet by mouth every 6 (six) hours as needed for severe pain. 02/14/17   Joni ReiningSmith, Ronald K, PA-C  sulfamethoxazole-trimethoprim (BACTRIM DS,SEPTRA DS) 800-160 MG tablet Take 1 tablet by mouth 2 (two) times daily. 02/14/17   Joni ReiningSmith, Ronald K, PA-C    Allergies Bee venom  Family History  Problem Relation Age of Onset  . Diabetes Mother     Social History Social History   Tobacco Use  . Smoking status: Current Every Day Smoker    Packs/day: 0.50    Types: Cigarettes  . Smokeless tobacco: Never Used  Substance Use Topics  . Alcohol use: No  . Drug use: No    Review of Systems  Constitutional: Negative for fever. Cardiovascular: Negative for chest pain. Respiratory: Negative for shortness of breath. Gastrointestinal: Negative for abdominal pain, vomiting and diarrhea. Genitourinary: Negative for dysuria.  Reports urgency, hesitancy, and hematuria as above.  Testicular pain as above. Musculoskeletal: Negative for back pain. Skin: Negative for rash. Neurological: Negative for headaches, focal weakness or numbness. ____________________________________________  PHYSICAL EXAM:  VITAL SIGNS: ED Triage Vitals  Enc Vitals Group     BP 08/31/17 1753 (!) 141/80     Pulse Rate 08/31/17 1753 100     Resp 08/31/17 1753 20     Temp 08/31/17 1753 98.5 F (36.9  C)     Temp Source 08/31/17 1753 Oral     SpO2 08/31/17 1753 100 %     Weight 08/31/17 1753 170 lb (77.1 kg)     Height 08/31/17 1753 5\' 5"  (1.651 m)     Head Circumference --      Peak Flow --      Pain Score 08/31/17 1758 9     Pain Loc --      Pain Edu? --      Excl. in GC? --    Constitutional: Alert and oriented. Well appearing and in no distress. Head: Normocephalic and atraumatic. Cardiovascular: Normal rate, regular rhythm. Normal  distal pulses. Respiratory: Normal respiratory effort. No wheezes/rales/rhonchi. Gastrointestinal: Soft and nontender. No distention, rebound, guarding, or rigidity.  No CVA tenderness is appreciated.  Patient is mildly tender to palpation to the left lower abdominal-pelvic region. Musculoskeletal: Nontender with normal range of motion in all extremities.  Neurologic:  Normal gait without ataxia. Normal speech and language. No gross focal neurologic deficits are appreciated. Skin:  Skin is warm, dry and intact. No rash noted. Psychiatric: Mood and affect are normal. Patient exhibits appropriate insight and judgment. ____________________________________________   LABS (pertinent positives/negatives)  Labs Reviewed  URINALYSIS, COMPLETE (UACMP) WITH MICROSCOPIC - Abnormal; Notable for the following components:      Result Value   Color, Urine RED (*)    APPearance TURBID (*)    Glucose, UA   (*)    Value: TEST NOT REPORTED DUE TO COLOR INTERFERENCE OF URINE PIGMENT   Hgb urine dipstick   (*)    Value: TEST NOT REPORTED DUE TO COLOR INTERFERENCE OF URINE PIGMENT   Bilirubin Urine   (*)    Value: TEST NOT REPORTED DUE TO COLOR INTERFERENCE OF URINE PIGMENT   Ketones, ur   (*)    Value: TEST NOT REPORTED DUE TO COLOR INTERFERENCE OF URINE PIGMENT   Protein, ur   (*)    Value: TEST NOT REPORTED DUE TO COLOR INTERFERENCE OF URINE PIGMENT   Nitrite   (*)    Value: TEST NOT REPORTED DUE TO COLOR INTERFERENCE OF URINE PIGMENT   Leukocytes, UA   (*)    Value: TEST NOT REPORTED DUE TO COLOR INTERFERENCE OF URINE PIGMENT   Squamous Epithelial / LPF 0-5 (*)    All other components within normal limits  CBC WITH DIFFERENTIAL/PLATELET - Abnormal; Notable for the following components:   WBC 13.5 (*)    Neutro Abs 10.1 (*)    Monocytes Absolute 1.2 (*)    All other components within normal limits  BASIC METABOLIC PANEL - Abnormal; Notable for the following components:   Chloride 100 (*)     Glucose, Bld 100 (*)    All other components within normal limits  CHLAMYDIA/NGC RT PCR ([facility] ONLY)  URINE CULTURE  ____________________________________________   RADIOLOGY  CT Abd/Pelvis w/ CM  IMPRESSION: 1. Marked transmural thickening of the bladder despite under distention. Given its diffuse appearance, neoplasm is believed less likely. Findings may represent a severe cystitis. 2. Heterogeneous enhancement of the normal size prostate with hypodensity along the right lateral margin of the prostate, query prostatitis and early prostatic abscess. ____________________________________________  PROCEDURES  Procedures Toradol 30 mg IVP Cipro 500 mg PO ____________________________________________  INITIAL IMPRESSION / ASSESSMENT AND PLAN / ED COURSE  ----------------------------------------- 8:53 PM on 08/31/2017 ----------------------------------------- Spoke with Dr. Edwena Bunde (?) (radiology) about the best diagnostic modality for this presentation of gross hematuria with blood  clots. While CTU is most effective, it is a staged study usually performed in the outpatient setting. He suggested contrasted CT if renal calculi were not highly suspected.    Patient with ED evaluation of sudden onset of gross hematuria with passage of blood clots on presentation today.  His exam is overall benign.  His lab does show gross hematuria and leukocyturia.  His WBCs are elevated at 13.5 with a left shift.  His exam and labs are otherwise benign. Due to the acute nature of his presentation of blood clots, a contrasted CT study was ordered. It revealed signs of a severe cystitis. He is given ED doses of Cipro and ketorolac. He is given return precautions  And referred to urology routinely. A urine culture is pending at the time of discharge. ____________________________________________  FINAL CLINICAL IMPRESSION(S) / ED DIAGNOSES  Final diagnoses:  Cystitis  Gross hematuria       Karmen StabsMenshew, Charlesetta IvoryJenise V Bacon, PA-C 08/31/17 2359    Minna AntisPaduchowski, Kevin, MD 09/01/17 0028

## 2017-08-31 NOTE — ED Triage Notes (Signed)
Pt to ED c/o hematuria today passing blood clots, testicular pain.  States pressure to urinate.  Denies injury.  States pain to left side.  Denies discharge.

## 2017-08-31 NOTE — ED Notes (Signed)
See triage note  Presents with lower abd discomfort which radiates into testicles  Then noticed blood in urine today  Has had some nausea  No fever or vomiting

## 2017-08-31 NOTE — Discharge Instructions (Signed)
Your exam, labs, and CT scan revealed a severe bladder infection as the cause of your pain and bleeding. Take the antibiotic as directed, until all pills are gone. Take the anti-inflammatory pain medicine as needed. Follow-up with urology for recheck in 1-2 weeks. Return to the ED for for worsening symptoms, as discussed. Drink plenty of non-carbonated drinks and empty your bladder as needed.

## 2017-09-03 LAB — URINE CULTURE
Culture: >=100000 — AB
Special Requests: NORMAL

## 2018-06-26 IMAGING — CT CT ABD-PELV W/ CM
2 of 4 series · 16 of 46 positions shown, 18 images · IV contrast (APPLIED)
Comparison: None.

CLINICAL DATA: Lower abdominal discomfort radiating to the
testicles with hematuria.

EXAM:
CT ABDOMEN AND PELVIS WITH CONTRAST
TECHNIQUE: Multidetector CT imaging of the abdomen and pelvis was performed
using the standard protocol following bolus administration of
intravenous contrast.
CONTRAST:  100mL [id number] IOPAMIDOL (D54AS0-SEE) INJECTION 61%

[Series 2: routine abd/pel with · axial · 0.76mm/px · z∈[-1067,-612]mm · 13 of 99 slices shown, 15 images]
[im 4/99  soft-tissue]
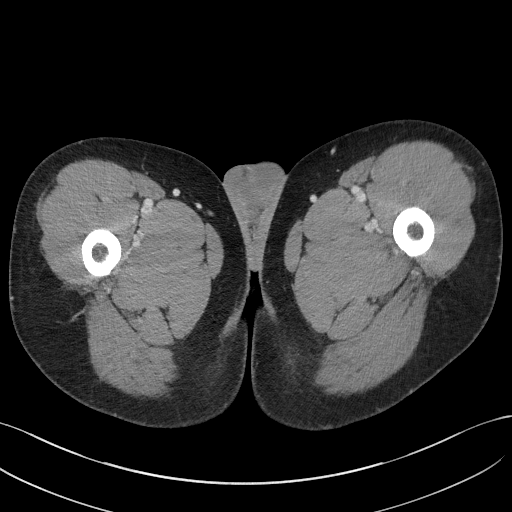
[im 4/99  bone]
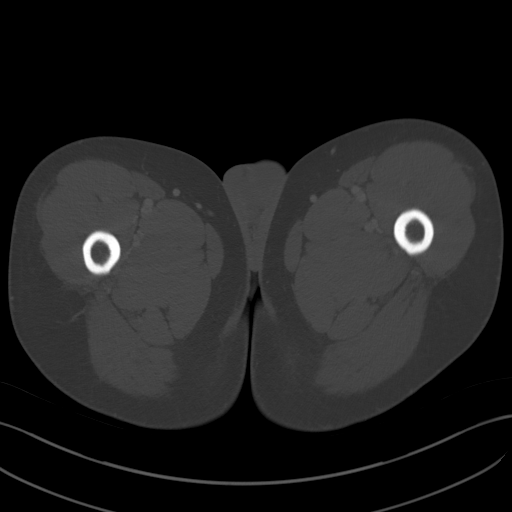
[im 12/99  soft-tissue]
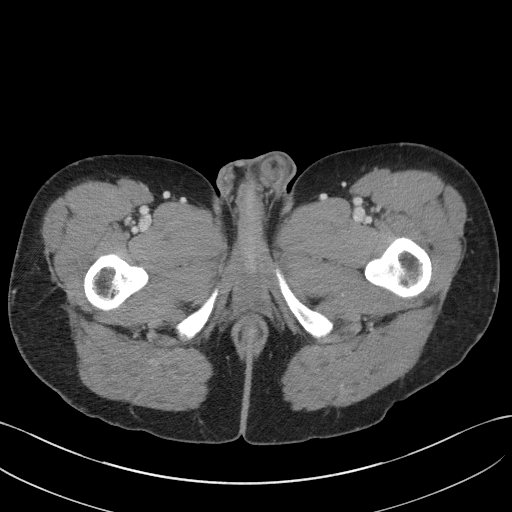
[im 20/99  soft-tissue]
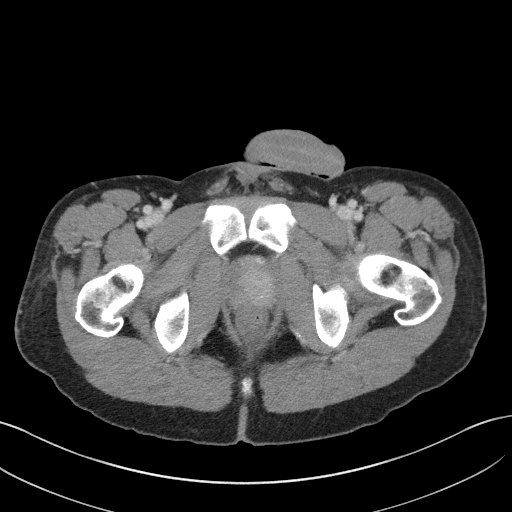
[im 28/99  soft-tissue]
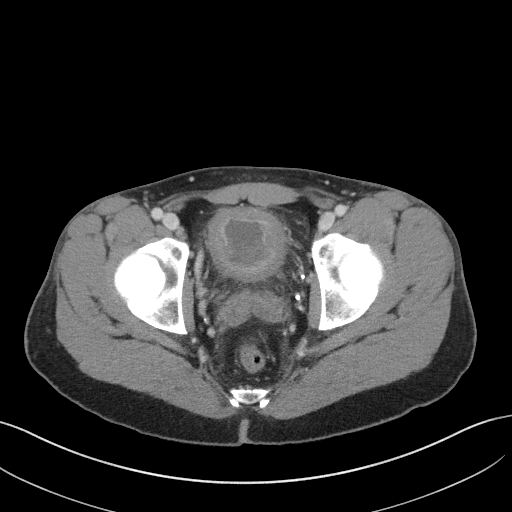
[im 36/99  soft-tissue]
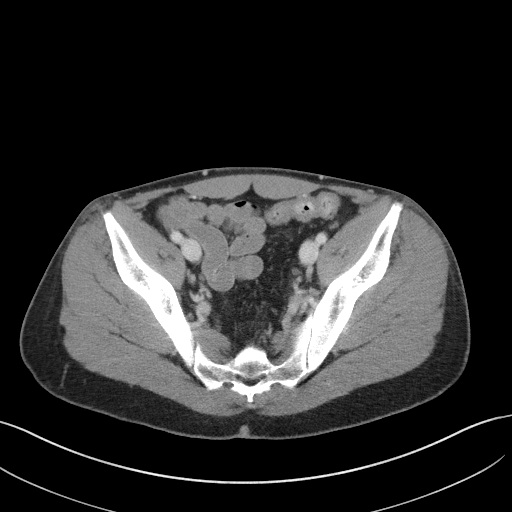
[im 44/99  soft-tissue]
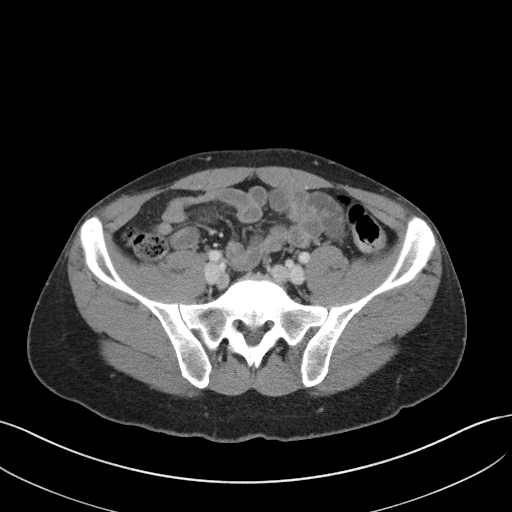
[im 51/99  soft-tissue]
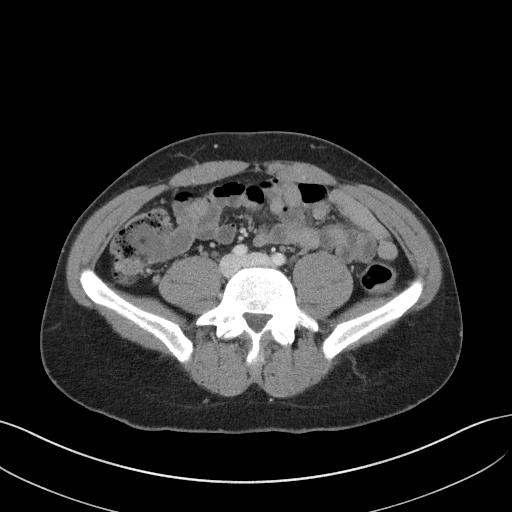
[im 55/99  soft-tissue]
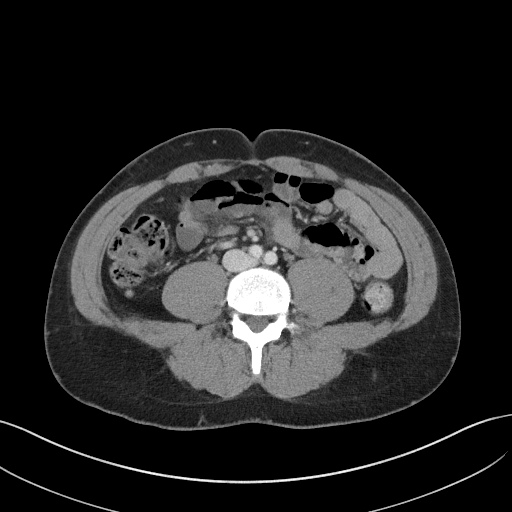
[im 63/99  soft-tissue]
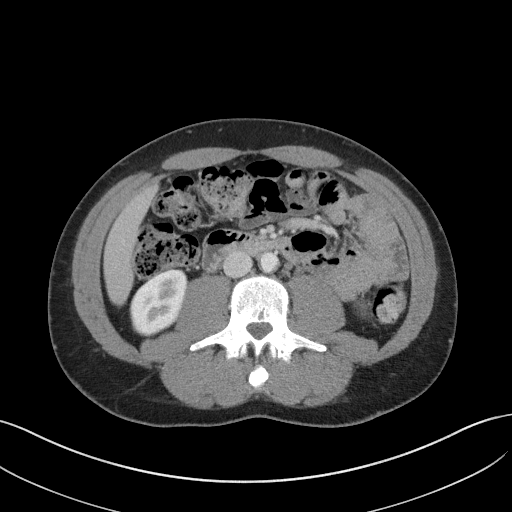
[im 63/99  bone]
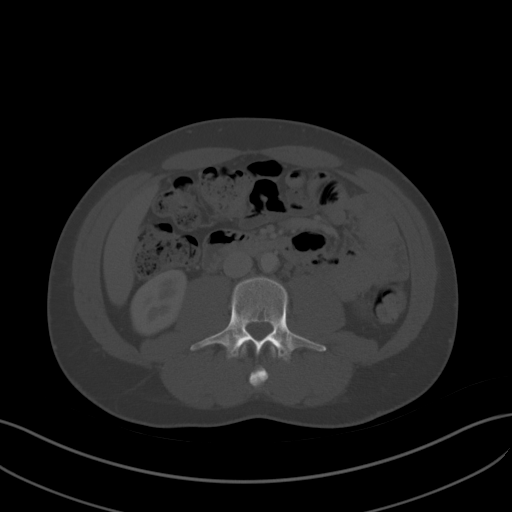
[im 71/99  soft-tissue]
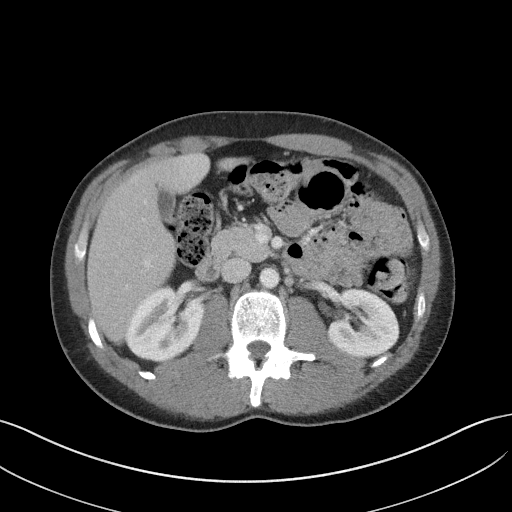
[im 79/99  soft-tissue]
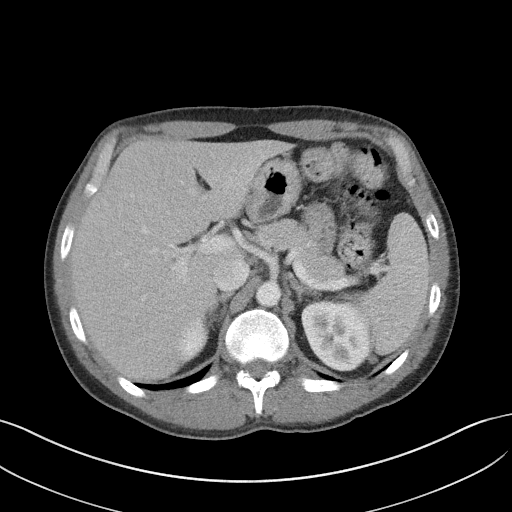
[im 87/99  soft-tissue]
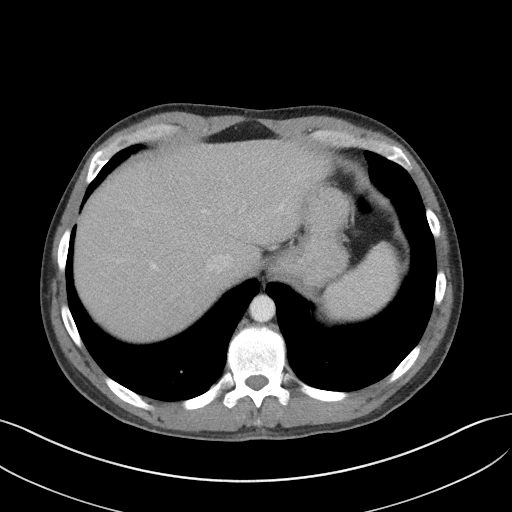
[im 95/99  soft-tissue]
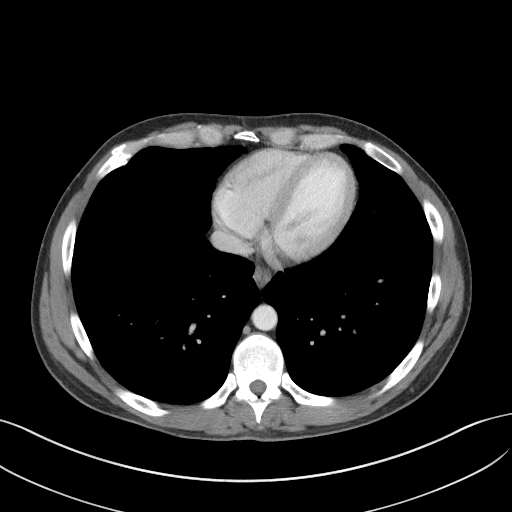

[Series 5: coronal st · coronal · 0.74mm/px · 3 of 81 slices shown]
[im 27/81  soft-tissue]
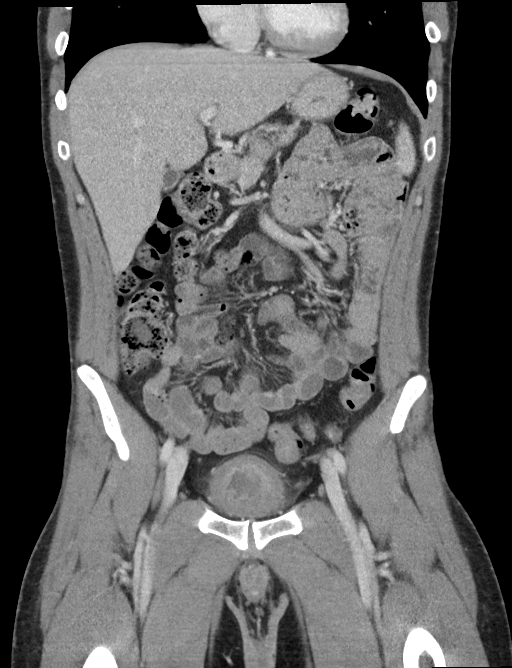
[im 36/81  soft-tissue]
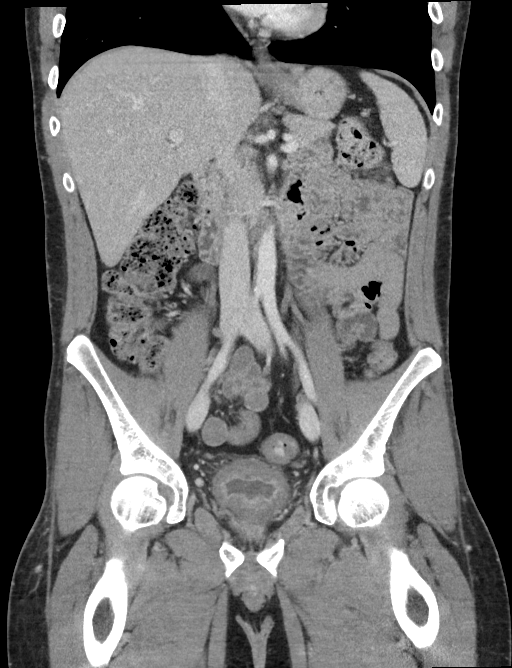
[im 45/81  soft-tissue]
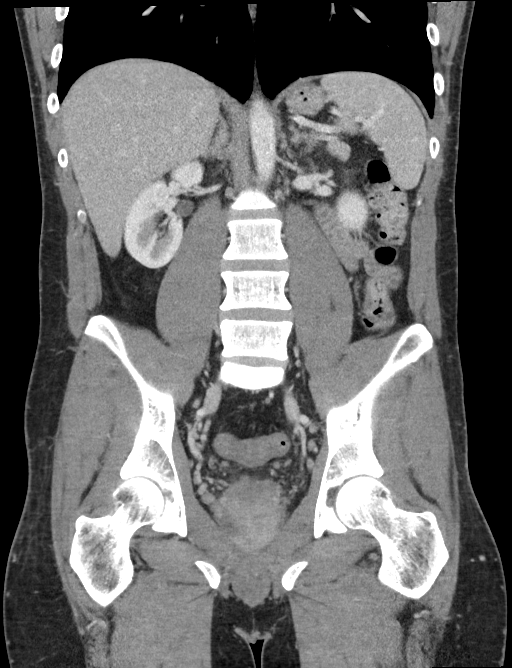

[16 of 46 positions shown; findings below may reference images not displayed]

FINDINGS: LOWER CHEST: Lung bases are clear. Included heart size is normal. No
pericardial effusion.

HEPATOBILIARY: Liver and gallbladder are normal.

PANCREAS: Normal.

SPLEEN: Normal size with adjacent small splenule posteriorly.

ADRENALS/URINARY TRACT: Kidneys are orthotopic, demonstrating
symmetric enhancement. No nephrolithiasis, hydronephrosis or solid
renal masses. Marked transmural thickening of the bladder albeit
slightly underdistended. Single wall thickness of the bladder is
cm. Normal adrenal glands.

STOMACH/BOWEL: The stomach, small and large bowel are normal in
course and caliber without inflammatory changes. Normal appendix.

VASCULAR/LYMPHATIC: Aortoiliac vessels are normal in course and
caliber. No lymphadenopathy by CT size criteria.

REPRODUCTIVE: Normal sized prostate however there is hypodensity
along the right lateral aspect of the prostate raising the
possibility prostatitis and possible early prostatic abscess.

OTHER: No intraperitoneal free fluid or free air.

MUSCULOSKELETAL: Nonacute.

Musculoskeletal: No acute or significant osseous findings.
IMPRESSION: 1. Marked transmural thickening of the bladder despite under
distention. Given its diffuse appearance, neoplasm is believed less
likely. Findings may represent a severe cystitis.
2. Heterogeneous enhancement of the normal size prostate with
hypodensity along the right lateral margin of the prostate, query
prostatitis and early prostatic abscess.
